# Patient Record
Sex: Male | Born: 1990 | Race: Black or African American | Hispanic: No | Marital: Single | Smoking: Current every day smoker
Health system: Southern US, Community
[De-identification: ages and names within clinical notes are randomized; demographics above are authoritative.]

## PROBLEM LIST (undated history)

## (undated) HISTORY — PX: ABDOMINAL SURGERY: SHX537

---

## 2020-01-08 ENCOUNTER — Other Ambulatory Visit: Payer: Self-pay

## 2020-01-08 ENCOUNTER — Encounter (HOSPITAL_COMMUNITY): Payer: Self-pay

## 2020-01-08 ENCOUNTER — Ambulatory Visit (HOSPITAL_COMMUNITY)
Admission: EM | Admit: 2020-01-08 | Discharge: 2020-01-08 | Disposition: A | Discharge status: Home / Self Care | Payer: HRSA Program | Attending: Family Medicine | Admitting: Family Medicine

## 2020-01-08 DIAGNOSIS — R197 Diarrhea, unspecified: Secondary | ICD-10-CM | POA: Diagnosis present

## 2020-01-08 DIAGNOSIS — R110 Nausea: Secondary | ICD-10-CM | POA: Insufficient documentation

## 2020-01-08 DIAGNOSIS — Z20822 Contact with and (suspected) exposure to covid-19: Secondary | ICD-10-CM | POA: Insufficient documentation

## 2020-01-08 MED ORDER — ONDANSETRON 4 MG PO TBDP: 4.0000 mg | ORAL_TABLET | Freq: Three times a day (TID) | ORAL | 0 refills | Status: AC | PRN

## 2020-01-08 NOTE — Discharge Instructions (Signed)
COVID test pending, monitor mychart for results Zofran for nausea Push fluids Follow up if symptoms not improving or worsening

## 2020-01-08 NOTE — ED Triage Notes (Signed)
Pt c/o nausea and vomiting on Monday and Tuesday, has improved since but is requesting COVID testing

## 2020-01-09 LAB — SARS CORONAVIRUS 2 (TAT 6-24 HRS): SARS Coronavirus 2: POSITIVE — AB

## 2020-01-09 NOTE — ED Provider Notes (Signed)
[facility]    CSN: [id number] Arrival date & time: 01/08/20  1904      History   Chief Complaint Chief Complaint  Patient presents with   Nausea    HPI Brian Glenn is a 29 y.o. male presenting today for Covid testing. Patient reports that earlier this week he was having GI symptoms of nausea vomiting and diarrhea. He reports that he is no longer vomiting, continues to feel mildly nauseous and continues to have some mild diarrhea as well. Work requesting Covid test prior to returning. Denies associated URI symptoms. Denies known exposure. Denies fevers. Denies abdominal pain. Tolerating oral intake.  HPI  History reviewed. No pertinent past medical history.  There are no problems to display for this patient.   History reviewed. No pertinent surgical history.     Home Medications    Prior to Admission medications   Medication Sig Start Date End Date Taking? Authorizing Provider  ondansetron (ZOFRAN ODT) 4 MG disintegrating tablet Take 1 tablet (4 mg total) by mouth every 8 (eight) hours as needed for nausea or vomiting. 01/08/20   Lorraine Cimmino, Junius Creamer, PA-C    Family History No family history on file.  Social History Social History   Tobacco Use   Smoking status: Not on file  Substance Use Topics   Alcohol use: Not on file   Drug use: Not on file     Allergies   Patient has no known allergies.   Review of Systems Review of Systems  Constitutional: Negative for activity change, appetite change, chills, fatigue and fever.  HENT: Negative for congestion, ear pain, rhinorrhea, sinus pressure, sore throat and trouble swallowing.   Eyes: Negative for discharge and redness.  Respiratory: Negative for cough, chest tightness and shortness of breath.   Cardiovascular: Negative for chest pain.  Gastrointestinal: Positive for diarrhea and nausea. Negative for abdominal pain and vomiting.  Musculoskeletal: Negative for myalgias.  Skin: Negative for rash.   Neurological: Negative for dizziness, light-headedness and headaches.     Physical Exam Triage Vital Signs ED Triage Vitals [01/08/20 1949]  Enc Vitals Group     BP 139/76     Pulse Rate 66     Resp 16     Temp 98 F (36.7 C)     Temp src      SpO2 100 %     Weight      Height      Head Circumference      Peak Flow      Pain Score 0     Pain Loc      Pain Edu?      Excl. in GC?    No data found.  Updated Vital Signs BP 139/76    Pulse 66    Temp 98 F (36.7 C)    Resp 16    SpO2 100%   Visual Acuity Right Eye Distance:   Left Eye Distance:   Bilateral Distance:    Right Eye Near:   Left Eye Near:    Bilateral Near:     Physical Exam Vitals and nursing note reviewed.  Constitutional:      Appearance: He is well-developed.     Comments: No acute distress  HENT:     Head: Normocephalic and atraumatic.     Ears:     Comments: Bilateral ears without tenderness to palpation of external auricle, tragus and mastoid, EAC's without erythema or swelling, TM's with good bony landmarks and cone of  light. Non erythematous.     Nose: Nose normal.     Mouth/Throat:     Comments: Oral mucosa pink and moist, no tonsillar enlargement or exudate. Posterior pharynx patent and nonerythematous, no uvula deviation or swelling. Normal phonation. Eyes:     Conjunctiva/sclera: Conjunctivae normal.  Cardiovascular:     Rate and Rhythm: Normal rate.  Pulmonary:     Effort: Pulmonary effort is normal. No respiratory distress.     Comments: Breathing comfortably at rest, CTABL, no wheezing, rales or other adventitious sounds auscultated Abdominal:     General: There is no distension.     Comments: Soft, nondistended, nontender to light and deep palpation throughout abdomen  Musculoskeletal:        General: Normal range of motion.     Cervical back: Neck supple.  Skin:    General: Skin is warm and dry.  Neurological:     Mental Status: He is alert and oriented to person, place,  and time.      UC Treatments / Results  Labs (all labs ordered are listed, but only abnormal results are displayed) Labs Reviewed  SARS CORONAVIRUS 2 (TAT 6-24 HRS) - Abnormal; Notable for the following components:      Result Value   SARS Coronavirus 2 POSITIVE (*)    All other components within normal limits    EKG   Radiology No results found.  Procedures Procedures (including critical care time)  Medications Ordered in UC Medications - No data to display  Initial Impression / Assessment and Plan / UC Course  I have reviewed the triage vital signs and the nursing notes.  Pertinent labs & imaging results that were available during my care of the patient were reviewed by me and considered in my medical decision making (see chart for details).     Covid test pending, monitor my chart for results. Continue symptomatic and supportive care, Zofran for nausea push fluids and oral rehydration.  Discussed strict return precautions. Patient verbalized understanding and is agreeable with plan.  Final Clinical Impressions(s) / UC Diagnoses   Final diagnoses:  Nausea  Diarrhea, unspecified type  Encounter for laboratory testing for COVID-19 virus     Discharge Instructions     COVID test pending, monitor mychart for results Zofran for nausea Push fluids Follow up if symptoms not improving or worsening   ED Prescriptions    Medication Sig Dispense Auth. Provider   ondansetron (ZOFRAN ODT) 4 MG disintegrating tablet Take 1 tablet (4 mg total) by mouth every 8 (eight) hours as needed for nausea or vomiting. 20 tablet Ashtan Laton, [place] C, PA-C     PDMP not reviewed this encounter.   Sharyon Cable Shiner C, [place] 01/09/20 [phone]

## 2020-02-28 ENCOUNTER — Emergency Department (HOSPITAL_COMMUNITY): Payer: Self-pay

## 2020-02-28 ENCOUNTER — Encounter (HOSPITAL_COMMUNITY): Payer: Self-pay | Admitting: Emergency Medicine

## 2020-02-28 ENCOUNTER — Other Ambulatory Visit: Payer: Self-pay

## 2020-02-28 ENCOUNTER — Emergency Department (HOSPITAL_COMMUNITY)
Admission: EM | Admit: 2020-02-28 | Discharge: 2020-02-28 | Disposition: A | Discharge status: Home / Self Care | Payer: Self-pay | Attending: Emergency Medicine | Admitting: Emergency Medicine

## 2020-02-28 DIAGNOSIS — F17200 Nicotine dependence, unspecified, uncomplicated: Secondary | ICD-10-CM | POA: Insufficient documentation

## 2020-02-28 DIAGNOSIS — K047 Periapical abscess without sinus: Secondary | ICD-10-CM | POA: Insufficient documentation

## 2020-02-28 LAB — I-STAT CHEM 8, ED
BUN: 9 mg/dL (ref 6–20)
Calcium, Ion: 1.13 mmol/L — ABNORMAL LOW (ref 1.15–1.40)
Chloride: 106 mmol/L (ref 98–111)
Creatinine, Ser: 1.2 mg/dL (ref 0.61–1.24)
Glucose, Bld: 88 mg/dL (ref 70–99)
HCT: 46 % (ref 39.0–52.0)
Hemoglobin: 15.6 g/dL (ref 13.0–17.0)
Potassium: 5.9 mmol/L — ABNORMAL HIGH (ref 3.5–5.1)
Sodium: 139 mmol/L (ref 135–145)
TCO2: 26 mmol/L (ref 22–32)

## 2020-02-28 MED ORDER — CLINDAMYCIN PHOSPHATE 900 MG/50ML IV SOLN
900.0000 mg | Freq: Once | INTRAVENOUS | Status: AC
  Administered 2020-02-28: 900 mg via INTRAVENOUS
  Filled 2020-02-28: qty 50

## 2020-02-28 MED ORDER — CLINDAMYCIN HCL 150 MG PO CAPS: 450.0000 mg | ORAL_CAPSULE | Freq: Three times a day (TID) | ORAL | 0 refills | Status: AC

## 2020-02-28 MED ORDER — LIDOCAINE-EPINEPHRINE 1 %-1:100000 IJ SOLN
10.0000 mL | Freq: Once | INTRAMUSCULAR | Status: DC
  Filled 2020-02-28: qty 1

## 2020-02-28 MED ORDER — IBUPROFEN 600 MG PO TABS
600.0000 mg | ORAL_TABLET | Freq: Four times a day (QID) | ORAL | 0 refills | Status: DC | PRN
Start: 2020-02-28 — End: 2022-05-08

## 2020-02-28 MED ORDER — HYDROCODONE-ACETAMINOPHEN 5-325 MG PO TABS
1.0000 | ORAL_TABLET | Freq: Once | ORAL | Status: AC
  Administered 2020-02-28: 1 via ORAL
  Filled 2020-02-28: qty 1

## 2020-02-28 MED ORDER — IOHEXOL 300 MG/ML  SOLN
75.0000 mL | Freq: Once | INTRAMUSCULAR | Status: AC | PRN
  Administered 2020-02-28: 75 mL via INTRAVENOUS

## 2020-02-28 NOTE — ED Triage Notes (Signed)
Pt. Stated, Brian Glenn had a toothache since Friday and when I woke up this morning the rt. Side of my face and jaw is swollen.

## 2020-02-28 NOTE — ED Provider Notes (Signed)
  Dental Block  Date/Time: 02/28/2020 5:00 PM Performed by: Anselm Pancoast, PA-C Authorized by: Anselm Pancoast, PA-C   Consent:    Consent obtained:  Verbal   Consent given by:  Patient   Risks discussed:  Infection, nerve damage, swelling, unsuccessful block and pain Indications:    Indications: dental abscess   Location:    Block type:  Inferior alveolar   Laterality:  Right Procedure details (see MAR for exact dosages):    Topical anesthetic:  Benzocaine gel   Syringe type:  Controlled syringe   Needle gauge:  27 G   Anesthetic injected:  Bupivacaine 0.5% WITH epi   Injection procedure:  Anatomic landmarks identified, anatomic landmarks palpated, introduced needle, negative aspiration for blood and incremental injection Post-procedure details:    Outcome:  Anesthesia achieved   Patient tolerance of procedure:  Tolerated well, no immediate complications  .Marland KitchenIncision and Drainage  Date/Time: 02/28/2020 5:52 PM Performed by: Anselm Pancoast, PA-C Authorized by: Anselm Pancoast, PA-C   Consent:    Consent obtained:  Verbal   Consent given by:  Patient   Risks discussed:  Bleeding, incomplete drainage and pain Location:    Type:  Abscess   Size:  1.7   Location:  Mouth   Mouth location:  Alveolar process Pre-procedure details:    Skin preparation:  Antiseptic wash Anesthesia (see MAR for exact dosages):    Anesthesia method:  Topical application and nerve block   Topical anesthetic:  Benzocaine gel   Block location:  Inferior alveolar and apical   Block needle gauge:  27 G   Block anesthetic:  Bupivacaine 0.5% WITH epi   Block injection procedure:  Anatomic landmarks identified, anatomic landmarks palpated, introduced needle, negative aspiration for blood and incremental injection   Block outcome:  Anesthesia achieved Procedure type:    Complexity:  Simple Procedure details:    Needle aspiration: yes     Needle size:  18 G   Incision depth:  Submucosal   Wound management:   Irrigated with saline   Drainage:  Bloody and purulent   Drainage amount:  Scant   Packing materials:  None Post-procedure details:    Patient tolerance of procedure:  Tolerated well, no immediate complications     Concepcion Living 02/28/20 1801    Tilden Fossa, MD 02/28/20 [phone]

## 2020-02-28 NOTE — ED Notes (Signed)
Pt has + swelling of right side of face. Airway is intact. Pt not drooling.

## 2020-02-28 NOTE — ED Provider Notes (Signed)
[facility] EMERGENCY DEPARTMENT Provider Note   CSN: [id number] Arrival date & time: 02/28/20  1026     History Chief Complaint  Patient presents with  . Dental Problem  . Oral Swelling    Brian Glenn is a 29 y.o. male.  The history is provided by the patient.   Brian Glenn is a 29 y.o. male who presents to the Emergency Department complaining of dental pain and facial swelling. Two days ago he had pain to his right lower jaw. Yesterday he woke up with swelling to his jaw and neck. He denies any fevers, difficulty breathing, difficulty swallowing. No change in voice. He does not have a dentist. Symptoms are severe and constant nature. He has no known medical problems and takes no medications. He did try ibuprofen at home for his pain.    History reviewed. No pertinent past medical history.  There are no problems to display for this patient.   History reviewed. No pertinent surgical history.     No family history on file.  Social History   Tobacco Use  . Smoking status: Current Every Day Smoker  . Smokeless tobacco: Never Used  Substance Use Topics  . Alcohol use: Not on file  . Drug use: Yes    Types: Marijuana    Home Medications Prior to Admission medications   Medication Sig Start Date End Date Taking? Authorizing Provider  clindamycin (CLEOCIN) 150 MG capsule Take 3 capsules (450 mg total) by mouth 3 (three) times daily. 02/28/20   Tilden Fossa, MD  ibuprofen (ADVIL) 600 MG tablet Take 1 tablet (600 mg total) by mouth every 6 (six) hours as needed. 02/28/20   Tilden Fossa, MD  ondansetron (ZOFRAN ODT) 4 MG disintegrating tablet Take 1 tablet (4 mg total) by mouth every 8 (eight) hours as needed for nausea or vomiting. 01/08/20   Wieters, Hallie C, PA-C    Allergies    Patient has no known allergies.  Review of Systems   Review of Systems  All other systems reviewed and are negative.   Physical Exam Updated Vital Signs BP  130/90   Pulse 68   Temp 98.3 F (36.8 C) (Oral)   Resp 18   Ht 5\' 8"  (1.727 m)   Wt 93 kg   SpO2 100%   BMI 31.17 kg/m   Physical Exam Vitals and nursing note reviewed.  Constitutional:      Appearance: He is well-developed.  HENT:     Head: Normocephalic and atraumatic.     Comments: Poor dentition.  No elevation of the floor of mouth.  There is moderate to severe edema of the right lower jaw with focal tenderness to palpation.  There is fullness to the right neck.   Cardiovascular:     Rate and Rhythm: Normal rate and regular rhythm.     Heart sounds: No murmur heard.   Pulmonary:     Effort: Pulmonary effort is normal. No respiratory distress.     Breath sounds: Normal breath sounds.  Abdominal:     Palpations: Abdomen is soft.     Tenderness: There is no abdominal tenderness. There is no guarding or rebound.  Musculoskeletal:        General: No tenderness.  Skin:    General: Skin is warm and dry.  Neurological:     Mental Status: He is alert and oriented to person, place, and time.  Psychiatric:        Behavior: Behavior normal.  ED Results / Procedures / Treatments   Labs (all labs ordered are listed, but only abnormal results are displayed) Labs Reviewed  I-STAT CHEM 8, ED - Abnormal; Notable for the following components:      Result Value   Potassium 5.9 (*)    Calcium, Ion 1.13 (*)    All other components within normal limits    EKG EKG Interpretation  Date/Time:  Sunday February 28 2020 15:39:03 EDT Ventricular Rate:  59 PR Interval:    QRS Duration: 90 QT Interval:  374 QTC Calculation: 371 R Axis:   76 Text Interpretation: Sinus rhythm Confirmed by Tilden Fossa [phone]) on 02/28/2020 5:19:11 PM   Radiology CT Soft Tissue Neck W Contrast  Result Date: 02/28/2020 CLINICAL DATA:  Neck abscess. EXAM: CT NECK WITH CONTRAST TECHNIQUE: Multidetector CT imaging of the neck was performed using the standard protocol following the bolus  administration of intravenous contrast. CONTRAST:  68mL OMNIPAQUE IOHEXOL 300 MG/ML  SOLN COMPARISON:  None. FINDINGS: 1.7 x 0.7 cm peripherally enhancing right perimandibular fluid collection communicating with a periapical lucency along the right mandibular first molar root (3:69, 8:69). Diffuse right greater than left submandibular soft tissue swelling. Bilateral submandibular adenopathy measuring up to 1.1 cm in the right 1B region (3:73), reactive. Diffuse thickening of the right platysma. Periapical lucency involving the left mandibular second molar root. Pharynx and larynx: No mass.  No significant airway narrowing. Salivary glands: Bilateral parotid and submandibular glands are unremarkable. Thyroid: Normal. Lymph nodes: Prominent subcentimeter bilateral 2A nodes, also reactive. Vascular: Normal intravascular enhancement. Limited intracranial: Grossly unremarkable. Visualized orbits: Normal orbits. Mastoids and visualized paranasal sinuses: Clear paranasal sinuses. No mastoid effusion. Skeleton: No acute osseous abnormality. Upper chest: Clear apices. Other: None. IMPRESSION: 1.7 cm right perimandibular odontogenic abscess likely arising from the right mandibular first molar root. Right greater than left perimandibular soft tissue swelling with reactive adenopathy. Periapical lucency along the left mandibular second molar root may reflect carious disease. Electronically Signed   By: Stana Bunting M.D.   On: 02/28/2020 15:13    Procedures Procedures (including critical care time)  Medications Ordered in ED Medications  clindamycin (CLEOCIN) IVPB 900 mg (0 mg Intravenous Stopped 02/28/20 1502)  HYDROcodone-acetaminophen (NORCO/VICODIN) 5-325 MG per tablet 1 tablet (1 tablet Oral Given 02/28/20 1350)  iohexol (OMNIPAQUE) 300 MG/ML solution 75 mL (75 mLs Intravenous Contrast Given 02/28/20 1435)    ED Course  I have reviewed the triage vital signs and the nursing notes.  Pertinent labs &  imaging results that were available during my care of the patient were reviewed by me and considered in my medical decision making (see chart for details).    MDM Rules/Calculators/A&P                         patient here for evaluation of facial pain and swelling. Examination is consistent with dental abscess. CTA obtained to evaluate extent of abscess. I stat chem 8 obtained for creatinine prior to CT.  Chem 8 with mild hyperkalemia - unclear if specimen hemolyzed.  Cr is wnl and EKG without acute abnormalities.  CT demonstrates periapical abscess, no evidence of extension into neck.  I and D performed per PA note.  Plan to treat with abx with close dentistry follow up and return precautions.   Final Clinical Impression(s) / ED Diagnoses Final diagnoses:  Dental abscess    Rx / DC Orders ED Discharge Orders  Ordered    clindamycin (CLEOCIN) 150 MG capsule  3 times daily        02/28/20 1644    ibuprofen (ADVIL) 600 MG tablet  Every 6 hours PRN        02/28/20 1644           Tilden Fossa, MD 02/28/20 [phone]

## 2021-12-20 IMAGING — CT CT NECK W/ CM
4 of 5 series · 15 of 33 positions shown, 17 images · IV contrast (Omni 300)
Comparison: None.

CLINICAL DATA: Neck abscess.

EXAM:
CT NECK WITH CONTRAST
TECHNIQUE: Multidetector CT imaging of the neck was performed using the
standard protocol following the bolus administration of intravenous
contrast.
CONTRAST:  75mL OMNIPAQUE IOHEXOL 300 MG/ML  SOLN

[Series 3: neck 2.0 st · axial · 0.56mm/px · z∈[+1393,+1555]mm · 4 of 136 slices shown, 5 images]
[im 28/136  soft-tissue]
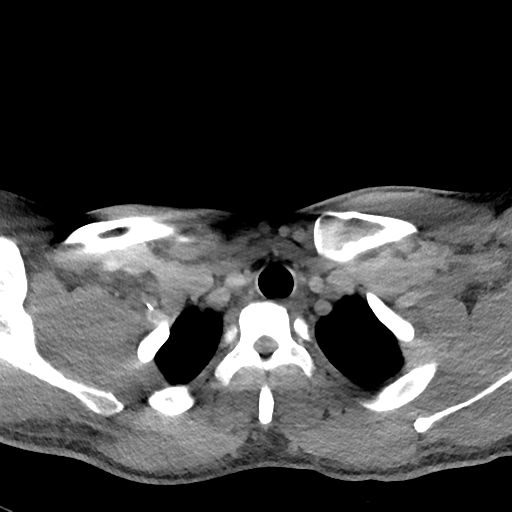
[im 28/136  bone]
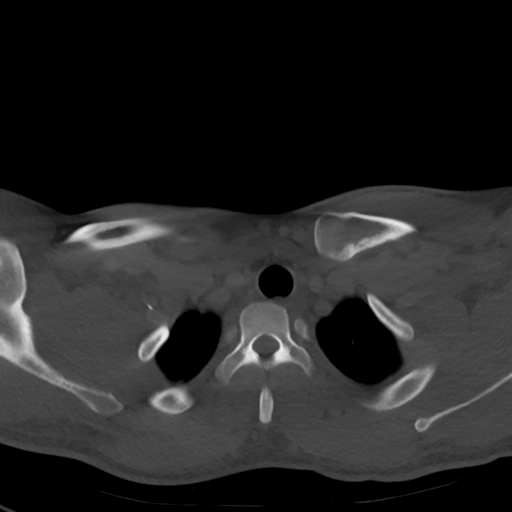
[im 55/136  bone]
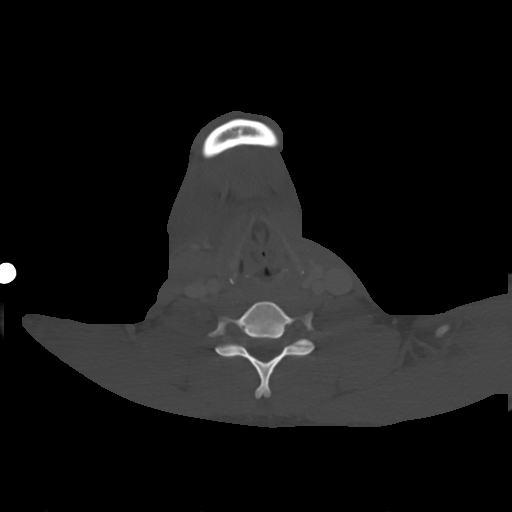
[im 82/136  bone]
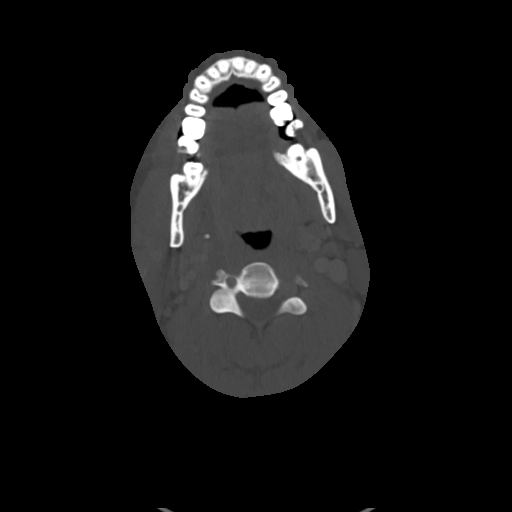
[im 109/136  bone]
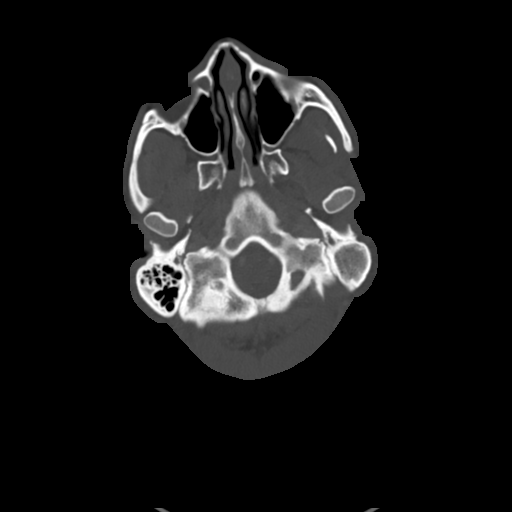

[Series 5: sagittal · sagittal · 0.53mm/px · 5 of 119 slices shown, 6 images]
[im 40/119  bone]
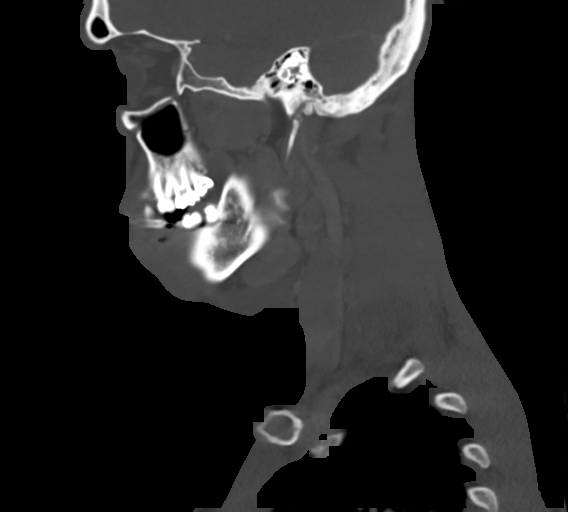
[im 50/119  bone]
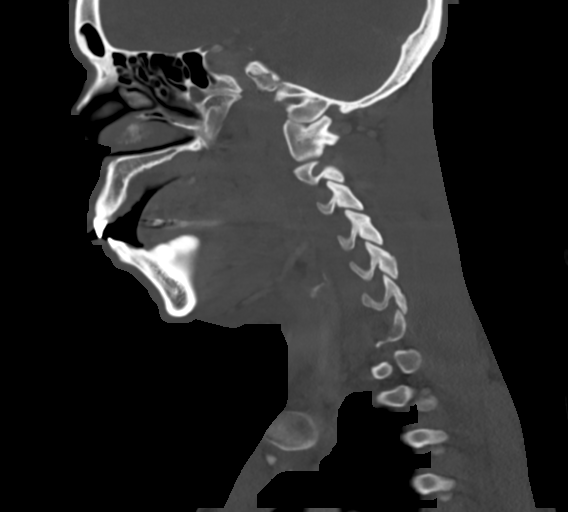
[im 60/119  soft-tissue]
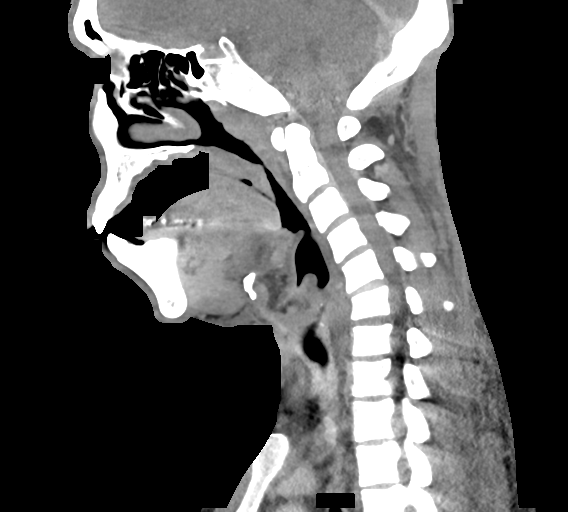
[im 60/119  bone]
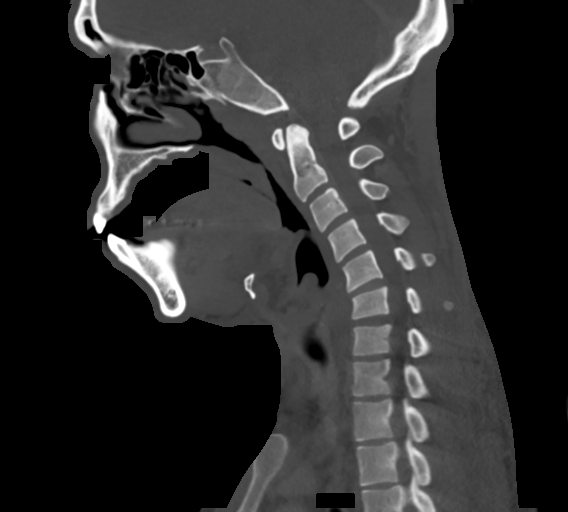
[im 69/119  bone]
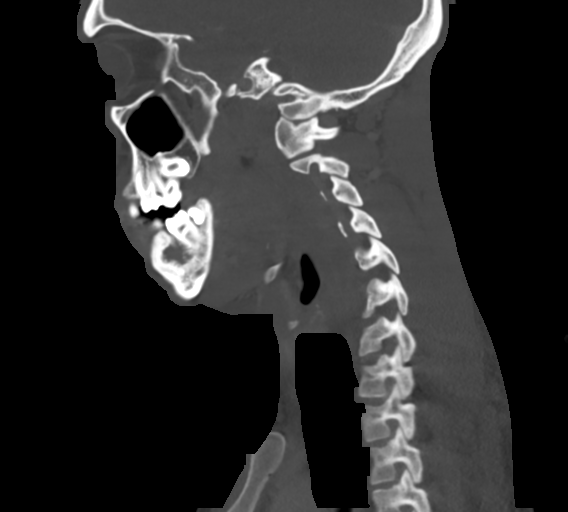
[im 79/119  bone]
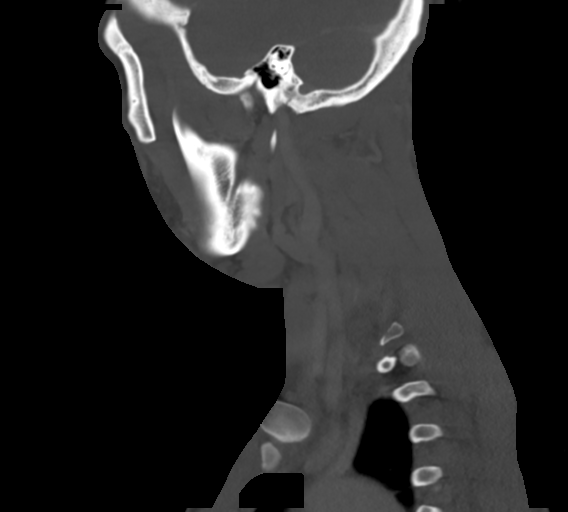

[Series 6: coronal · coronal · 0.59mm/px · 3 of 121 slices shown]
[im 25/121  bone]
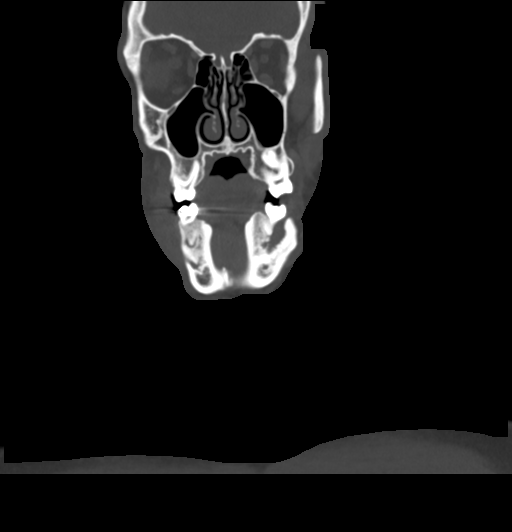
[im 49/121  bone]
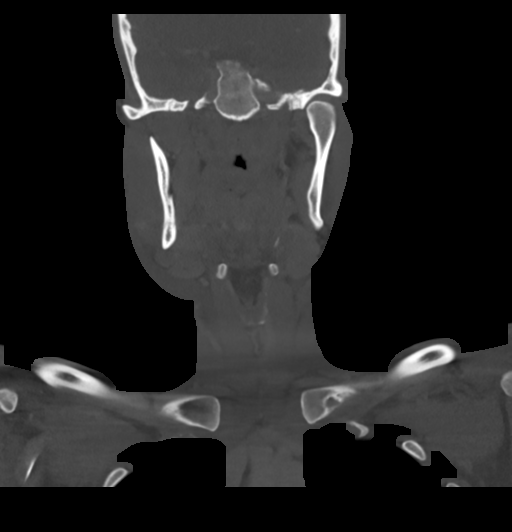
[im 73/121  bone]
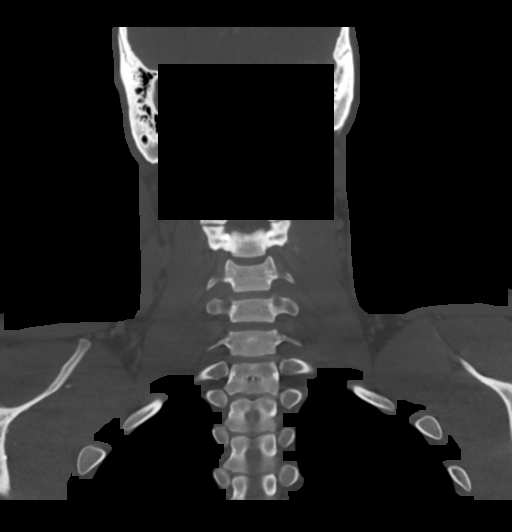

[Series 7: orthogonal · axial · 0.39mm/px · z∈[+1393,+1501]mm · 3 of 135 slices shown]
[im 27/135  bone]
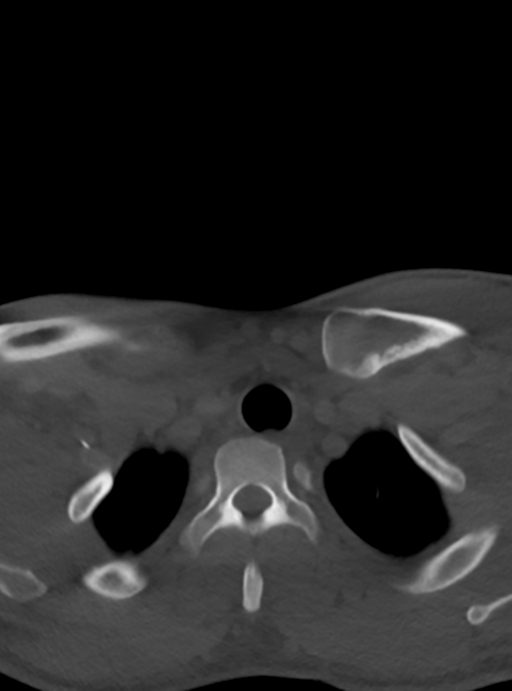
[im 54/135  bone]
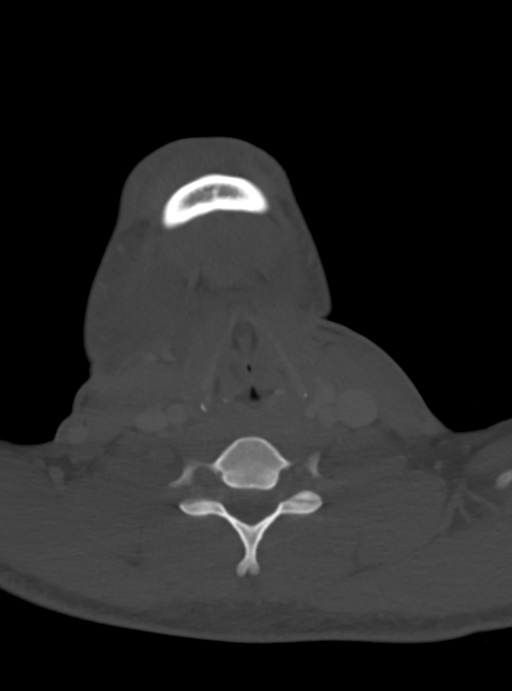
[im 81/135  bone]
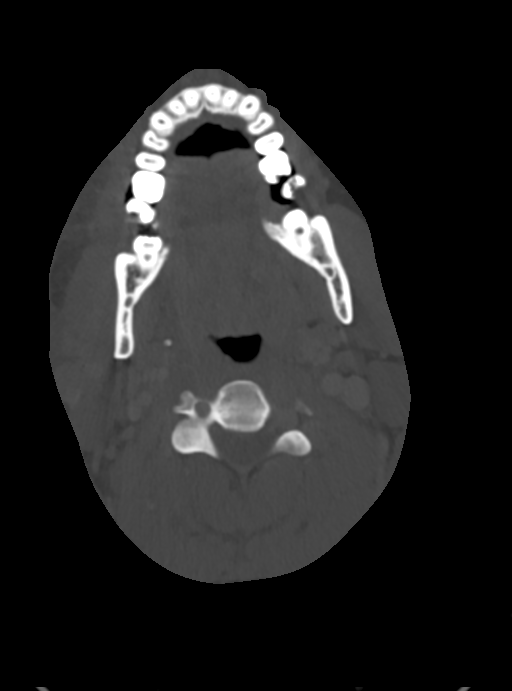

[15 of 33 positions shown; findings below may reference images not displayed]

FINDINGS: 1.7 x 0.7 cm peripherally enhancing right perimandibular fluid
collection communicating with a periapical lucency along the right
mandibular first molar root ([DATE], [DATE]).

Diffuse right greater than left submandibular soft tissue swelling.
Bilateral submandibular adenopathy measuring up to 1.1 cm in the
right 1B region ([DATE]), reactive.

Diffuse thickening of the right platysma. Periapical lucency
involving the left mandibular second molar root.

Pharynx and larynx: No mass.  No significant airway narrowing.

Salivary glands: Bilateral parotid and submandibular glands are
unremarkable.

Thyroid: Normal.

Lymph nodes: Prominent subcentimeter bilateral 2A nodes, also
reactive.

Vascular: Normal intravascular enhancement.

Limited intracranial: Grossly unremarkable.

Visualized orbits: Normal orbits.

Mastoids and visualized paranasal sinuses: Clear paranasal sinuses.
No mastoid effusion.

Skeleton: No acute osseous abnormality.

Upper chest: Clear apices.

Other: None.
IMPRESSION: 1.7 cm right perimandibular odontogenic abscess likely arising from
the right mandibular first molar root.

Right greater than left perimandibular soft tissue swelling with
reactive adenopathy.

Periapical lucency along the left mandibular second molar root may
reflect carious disease.

## 2022-05-08 ENCOUNTER — Encounter (HOSPITAL_COMMUNITY): Payer: Self-pay | Admitting: Emergency Medicine

## 2022-05-08 ENCOUNTER — Other Ambulatory Visit: Payer: Self-pay

## 2022-05-08 ENCOUNTER — Emergency Department (HOSPITAL_COMMUNITY)
Admission: EM | Admit: 2022-05-08 | Discharge: 2022-05-08 | Disposition: A | Discharge status: Home / Self Care | Payer: Self-pay | Attending: Emergency Medicine | Admitting: Emergency Medicine

## 2022-05-08 DIAGNOSIS — R051 Acute cough: Secondary | ICD-10-CM | POA: Insufficient documentation

## 2022-05-08 DIAGNOSIS — J101 Influenza due to other identified influenza virus with other respiratory manifestations: Secondary | ICD-10-CM | POA: Insufficient documentation

## 2022-05-08 DIAGNOSIS — Z1152 Encounter for screening for COVID-19: Secondary | ICD-10-CM | POA: Insufficient documentation

## 2022-05-08 DIAGNOSIS — F17200 Nicotine dependence, unspecified, uncomplicated: Secondary | ICD-10-CM | POA: Insufficient documentation

## 2022-05-08 LAB — RESP PANEL BY RT-PCR (FLU A&B, COVID) ARPGX2
Influenza A by PCR: POSITIVE — AB
Influenza B by PCR: NEGATIVE
SARS Coronavirus 2 by RT PCR: NEGATIVE

## 2022-05-08 MED ORDER — IBUPROFEN 600 MG PO TABS: 600.0000 mg | ORAL_TABLET | Freq: Four times a day (QID) | ORAL | 0 refills | Status: AC | PRN

## 2022-05-08 MED ORDER — ACETAMINOPHEN 325 MG PO TABS: 650.0000 mg | ORAL_TABLET | Freq: Four times a day (QID) | ORAL | 0 refills | Status: AC | PRN

## 2022-05-08 MED ORDER — GUAIFENESIN 100 MG/5ML PO LIQD: 5.0000 mL | ORAL | 0 refills | Status: AC | PRN

## 2022-05-08 MED ORDER — FLUTICASONE PROPIONATE 50 MCG/ACT NA SUSP: 1.0000 | Freq: Every day | NASAL | 0 refills | Status: AC

## 2022-05-08 NOTE — ED Triage Notes (Signed)
Pt arrived POV from home stating this weekend he had a fever, last week he had flu like symptoms. Pt states he feels a lot better today but still needed to be checked out.

## 2022-05-08 NOTE — ED Provider Triage Note (Signed)
Emergency Medicine Provider Triage Evaluation Note  Brian Glenn , a 31 y.o. male  was evaluated in triage.  Pt complains of fever, body aches, and fatigue over the weekend. He says his fever has broken now and he is feeling better, but wanted to get checked out.   Review of Systems  Positive:  Negative:   Physical Exam  BP 115/78 (BP Location: Right Arm)   Pulse 69   Temp 98.3 F (36.8 C)   Resp 16   Ht 5\' 9"  (1.753 m)   Wt 84.4 kg   SpO2 100%   BMI 27.47 kg/m  Gen:   Awake, no distress   Resp:  Normal effort  MSK:   Moves extremities without difficulty  Other:    Medical Decision Making  Medically screening exam initiated at 10:50 AM.  Appropriate orders placed.  Beauregard Jarrells was informed that the remainder of the evaluation will be completed by another provider, this initial triage assessment does not replace that evaluation, and the importance of remaining in the ED until their evaluation is complete.     Donnamarie Poag, PA-C 05/08/22 1051

## 2022-05-08 NOTE — ED Provider Notes (Signed)
[facility] EMERGENCY DEPARTMENT Provider Note   CSN: [id number] Arrival date & time: 05/08/22  9470     History  Chief Complaint  Patient presents with   Fever    Brian Glenn is a 31 y.o. male.  Patient as above with significant medical history as below, including no sig med hx who presents to the ED with complaint of uri s/s Onset early Saturday morning Low grade fever Coughing, rhinorrhea clear, non prod cough No n/v but poor appetite yesterday, has improved Had diarrhea yestd but resolved No brbpr or melena No urine changes No travel or sick contacts      History reviewed. No pertinent past medical history.  History reviewed. No pertinent surgical history.   The history is provided by the patient. No language interpreter was used.  Fever Associated symptoms: congestion, cough, diarrhea and rhinorrhea   Associated symptoms: no chest pain, no chills, no confusion, no headaches, no nausea, no rash and no vomiting        Home Medications Prior to Admission medications   Medication Sig Start Date End Date Taking? Authorizing Provider  acetaminophen (TYLENOL) 325 MG tablet Take 2 tablets (650 mg total) by mouth every 6 (six) hours as needed. 05/08/22  Yes Tanda Rockers A, DO  fluticasone (FLONASE) 50 MCG/ACT nasal spray Place 1 spray into both nostrils daily for 7 days. 05/08/22 05/15/22 Yes Sloan Leiter, DO  guaiFENesin (ROBITUSSIN) 100 MG/5ML liquid Take 5 mLs by mouth every 4 (four) hours as needed for cough or to loosen phlegm. 05/08/22  Yes Tanda Rockers A, DO  ibuprofen (ADVIL) 600 MG tablet Take 1 tablet (600 mg total) by mouth every 6 (six) hours as needed. 05/08/22  Yes Tanda Rockers A, DO  clindamycin (CLEOCIN) 150 MG capsule Take 3 capsules (450 mg total) by mouth 3 (three) times daily. 02/28/20   Tilden Fossa, MD  ondansetron (ZOFRAN ODT) 4 MG disintegrating tablet Take 1 tablet (4 mg total) by mouth every 8 (eight) hours as needed for  nausea or vomiting. 01/08/20   Wieters, Hallie C, PA-C      Allergies    Patient has no known allergies.    Review of Systems   Review of Systems  Constitutional:  Positive for fever. Negative for chills.  HENT:  Positive for congestion and rhinorrhea. Negative for facial swelling and trouble swallowing.   Eyes:  Negative for photophobia and visual disturbance.  Respiratory:  Positive for cough. Negative for shortness of breath.   Cardiovascular:  Negative for chest pain and palpitations.  Gastrointestinal:  Positive for diarrhea. Negative for abdominal pain, nausea and vomiting.  Endocrine: Negative for polydipsia and polyuria.  Genitourinary:  Negative for difficulty urinating and hematuria.  Musculoskeletal:  Negative for gait problem and joint swelling.  Skin:  Negative for pallor and rash.  Neurological:  Negative for syncope and headaches.  Psychiatric/Behavioral:  Negative for agitation and confusion.     Physical Exam Updated Vital Signs BP 122/82 (BP Location: Right Arm)   Pulse 66   Temp 98.9 F (37.2 C) (Oral)   Resp 16   Ht 5\' 9"  (1.753 m)   Wt 84.4 kg   SpO2 100%   BMI 27.47 kg/m  Physical Exam Vitals and nursing note reviewed.  Constitutional:      General: He is not in acute distress.    Appearance: Normal appearance. He is well-developed. He is not ill-appearing, toxic-appearing or diaphoretic.  HENT:     Head: Normocephalic  and atraumatic.     Right Ear: External ear normal.     Left Ear: External ear normal.     Mouth/Throat:     Mouth: Mucous membranes are moist.     Pharynx: No oropharyngeal exudate or posterior oropharyngeal erythema.  Eyes:     General: No scleral icterus. Cardiovascular:     Rate and Rhythm: Normal rate and regular rhythm.     Pulses: Normal pulses.     Heart sounds: Normal heart sounds.  Pulmonary:     Effort: Pulmonary effort is normal. No respiratory distress.     Breath sounds: Normal breath sounds.  Abdominal:      General: Abdomen is flat.     Palpations: Abdomen is soft.     Tenderness: There is no abdominal tenderness. There is no guarding or rebound.  Musculoskeletal:        General: Normal range of motion.     Cervical back: Normal range of motion. No rigidity.     Right lower leg: No edema.     Left lower leg: No edema.  Skin:    General: Skin is warm and dry.     Capillary Refill: Capillary refill takes less than 2 seconds.  Neurological:     Mental Status: He is alert and oriented to person, place, and time.  Psychiatric:        Mood and Affect: Mood normal.        Behavior: Behavior normal.     ED Results / Procedures / Treatments   Labs (all labs ordered are listed, but only abnormal results are displayed) Labs Reviewed  RESP PANEL BY RT-PCR (FLU A&B, COVID) ARPGX2 - Abnormal; Notable for the following components:      Result Value   Influenza A by PCR POSITIVE (*)    All other components within normal limits    EKG None  Radiology No results found.  Procedures Procedures    Medications Ordered in ED Medications - No data to display  ED Course/ Medical Decision Making/ A&P                           Medical Decision Making Risk OTC drugs. Prescription drug management.   This patient presents to the ED with chief complaint(s) of uri s/s with pertinent past medical history of as above which further complicates the presenting complaint. The complaint involves an extensive differential diagnosis and also carries with it a high risk of complications and morbidity.    The differential diagnosis includes but not limited to viral bacterial pna, strep, covid, flu, sinusitis, etc. Serious etiologies were considered.   The initial plan is to rvp   Additional history obtained: Additional history obtained from  na Records reviewed  prior ed visit, prior uc visit,  prior meds/imaging  Independent labs interpretation:  The following labs were independently interpreted:  FLU +'tive  Independent visualization of imaging: Imaging not necessary at this time  Cardiac monitoring was reviewed and interpreted by myself which shows na  Treatment and Reassessment: Symptoms stayed the same while in the ED< reports he is feeling better since yesterday  Consultation: - Consulted or discussed management/test interpretation w/ external professional: na   Consideration for admission or further workup: Admission was considered   Pt here with uri s/s, found to have flu, outside of tamiflu treatment window. Not hypoxic, he is HDS on ambient air. Well appearing overall. D/w pt in regards to supportive  care and o/p f/u  The patient improved significantly and was discharged in stable condition. Detailed discussions were had with the patient regarding current findings, and need for close f/u with PCP or on call doctor. The patient has been instructed to return immediately if the symptoms worsen in any way for re-evaluation. Patient verbalized understanding and is in agreement with current care plan. All questions answered prior to discharge.    Social Determinants of health: No pcp Thc use  Social History   Tobacco Use   Smoking status: Every Day   Smokeless tobacco: Never  Substance Use Topics   Drug use: Yes    Types: Marijuana            Final Clinical Impression(s) / ED Diagnoses Final diagnoses:  Influenza A  Acute cough    Rx / DC Orders ED Discharge Orders          Ordered    guaiFENesin (ROBITUSSIN) 100 MG/5ML liquid  Every 4 hours PRN        05/08/22 1436    acetaminophen (TYLENOL) 325 MG tablet  Every 6 hours PRN        05/08/22 1436    ibuprofen (ADVIL) 600 MG tablet  Every 6 hours PRN        05/08/22 1436    fluticasone (FLONASE) 50 MCG/ACT nasal spray  Daily        05/08/22 1436              Sloan Leiter, DO 05/08/22 1444

## 2022-05-08 NOTE — Discharge Instructions (Addendum)
It was a pleasure caring for you today in the emergency department. ° °Please return to the emergency department for any worsening or worrisome symptoms. ° ° °

## 2022-05-09 ENCOUNTER — Emergency Department (HOSPITAL_COMMUNITY)
Admission: EM | Admit: 2022-05-09 | Discharge: 2022-05-09 | Disposition: A | Discharge status: Home / Self Care | Payer: Self-pay | Attending: Emergency Medicine | Admitting: Emergency Medicine

## 2022-05-09 ENCOUNTER — Other Ambulatory Visit: Payer: Self-pay

## 2022-05-09 ENCOUNTER — Encounter (HOSPITAL_COMMUNITY): Payer: Self-pay

## 2022-05-09 DIAGNOSIS — J111 Influenza due to unidentified influenza virus with other respiratory manifestations: Secondary | ICD-10-CM | POA: Insufficient documentation

## 2022-05-09 LAB — CBG MONITORING, ED: Glucose-Capillary: 79 mg/dL (ref 70–99)

## 2022-05-09 MED ORDER — IBUPROFEN 800 MG PO TABS
800.0000 mg | ORAL_TABLET | Freq: Once | ORAL | Status: AC
  Administered 2022-05-09: 800 mg via ORAL
  Filled 2022-05-09: qty 1

## 2022-05-09 NOTE — ED Notes (Signed)
Pt called X2 for triage. Pt could not be found.  

## 2022-05-09 NOTE — Discharge Instructions (Signed)
Drink plenty of fluids.  Take Tylenol or Motrin for fever and aches.  And just rest

## 2022-05-09 NOTE — ED Triage Notes (Signed)
Pt arrived complaining of his flu symptoms returning states he was not able to pick up medications. Now having headache, body aches and dizziness   Pt states he has not had any new fevers

## 2022-05-09 NOTE — ED Provider Triage Note (Signed)
Emergency Medicine Provider Triage Evaluation Note  Brian Glenn , a 31 y.o. male  was evaluated in triage.  Pt complains of worsening body aches, sore throat, chills. Onset of symptoms on Saturday. He was diagnosed with the flu this morning. States that he was unable to take any medications for his symptoms because he "does not have access to medicines". He is presently homeless.  Review of Systems  Positive: As above Negative: As above  Physical Exam  BP 122/86 (BP Location: Right Arm)   Pulse (!) 57   Temp 97.8 F (36.6 C) (Oral)   Resp 18   SpO2 99%  Gen:   Awake, no distress   Resp:  Normal effort  MSK:   Moves extremities without difficulty  Other:  Lungs CTAB.   Medical Decision Making  Medically screening exam initiated at 2:32 AM.  Appropriate orders placed.  Brian Glenn was informed that the remainder of the evaluation will be completed by another provider, this initial triage assessment does not replace that evaluation, and the importance of remaining in the ED until their evaluation is complete.  Influenza, sequelae. Homeless without access to medications for symptom relief. Ibuprofen given in triage. May benefit from SW involvement in AM.   Antony Madura, [place] 05/09/22 0235

## 2022-05-09 NOTE — ED Provider Notes (Signed)
[facility] EMERGENCY DEPARTMENT Provider Note   CSN: [id number] Arrival date & time: 05/09/22  [id number]     History  Chief Complaint  Patient presents with   Influenza    Brian Glenn is a 31 y.o. male.  Patient was diagnosed with influenza yesterday.  He has been ill for over 3 days he was unable to get his medicines.  His symptoms have been going on since Friday.  He complains of myalgias.  No nausea no vomiting  The history is provided by the patient and medical records. No language interpreter was used.  Influenza Presenting symptoms: cough   Presenting symptoms: no diarrhea, no fatigue and no headaches   Severity:  Moderate Onset quality:  Sudden Progression:  Unchanged Chronicity:  New Relieved by:  Nothing Worsened by:  Nothing Associated symptoms: no chills and no congestion        Home Medications Prior to Admission medications   Medication Sig Start Date End Date Taking? Authorizing Provider  acetaminophen (TYLENOL) 325 MG tablet Take 2 tablets (650 mg total) by mouth every 6 (six) hours as needed. 05/08/22   Jeanell Sparrow, DO  clindamycin (CLEOCIN) 150 MG capsule Take 3 capsules (450 mg total) by mouth 3 (three) times daily. 02/28/20   Quintella Reichert, MD  fluticasone [facility]) 50 MCG/ACT nasal spray Place 1 spray into both nostrils daily for 7 days. 05/08/22 05/15/22  Jeanell Sparrow, DO  guaiFENesin (ROBITUSSIN) 100 MG/5ML liquid Take 5 mLs by mouth every 4 (four) hours as needed for cough or to loosen phlegm. 05/08/22   Jeanell Sparrow, DO  ibuprofen (ADVIL) 600 MG tablet Take 1 tablet (600 mg total) by mouth every 6 (six) hours as needed. 05/08/22   Jeanell Sparrow, DO  ondansetron (ZOFRAN ODT) 4 MG disintegrating tablet Take 1 tablet (4 mg total) by mouth every 8 (eight) hours as needed for nausea or vomiting. 01/08/20   Wieters, Hallie C, PA-C      Allergies    Patient has no known allergies.    Review of Systems   Review of Systems   Constitutional:  Negative for appetite change, chills and fatigue.  HENT:  Negative for congestion, ear discharge and sinus pressure.   Eyes:  Negative for discharge.  Respiratory:  Positive for cough.   Cardiovascular:  Negative for chest pain.  Gastrointestinal:  Negative for abdominal pain and diarrhea.  Genitourinary:  Negative for frequency and hematuria.  Musculoskeletal:  Negative for back pain.  Skin:  Negative for rash.  Neurological:  Negative for seizures and headaches.  Psychiatric/Behavioral:  Negative for hallucinations.     Physical Exam Updated Vital Signs BP 105/79 (BP Location: Left Arm)   Pulse (!) 57   Temp 97.8 F (36.6 C) (Oral)   Resp 16   Ht 5\' 9"  (1.753 m)   Wt 84.4 kg   SpO2 97%   BMI 27.47 kg/m  Physical Exam Vitals and nursing note reviewed.  Constitutional:      Appearance: He is well-developed.  HENT:     Head: Normocephalic.     Nose: Nose normal.  Eyes:     General: No scleral icterus.    Conjunctiva/sclera: Conjunctivae normal.  Neck:     Thyroid: No thyromegaly.  Cardiovascular:     Rate and Rhythm: Normal rate and regular rhythm.     Heart sounds: No murmur heard.    No friction rub. No gallop.  Pulmonary:     Breath sounds:  No stridor. No wheezing or rales.  Chest:     Chest wall: No tenderness.  Abdominal:     General: There is no distension.     Tenderness: There is no abdominal tenderness. There is no rebound.  Musculoskeletal:        General: Normal range of motion.     Cervical back: Neck supple.  Lymphadenopathy:     Cervical: No cervical adenopathy.  Skin:    Findings: No erythema or rash.  Neurological:     Mental Status: He is alert and oriented to person, place, and time.     Motor: No abnormal muscle tone.     Coordination: Coordination normal.  Psychiatric:        Behavior: Behavior normal.     ED Results / Procedures / Treatments   Labs (all labs ordered are listed, but only abnormal results are  displayed) Labs Reviewed  CBG MONITORING, ED    EKG None  Radiology No results found.  Procedures Procedures    Medications Ordered in ED Medications  ibuprofen (ADVIL) tablet 800 mg (has no administration in time range)  ibuprofen (ADVIL) tablet 800 mg (800 mg Oral Given 05/09/22 0253)    ED Course/ Medical Decision Making/ A&P                           Medical Decision Making Risk Prescription drug management.    This patient presents to the ED for concern of fevers aches, this involves an extensive number of treatment options, and is a complaint that carries with it a high risk of complications and morbidity.  The differential diagnosis includes influenza, pneumonia   Co morbidities that complicate the patient evaluation  None   Additional history obtained:  Additional history obtained from patient External records from outside source obtained and reviewed including hospital records   Lab Tests:  No labs Imaging Studies ordered: No imaging  Cardiac Monitoring: / EKG:  The patient was maintained on a cardiac monitor.  I personally viewed and interpreted the cardiac monitored which showed an underlying rhythm of: Normal sinus rhythm   Consultations Obtained:  No consultant  Problem List / ED Course / Critical interventions / Medication management  Influenza No medicines ordered Reevaluation of the patient after these medicines showed that the patient stayed the same I have reviewed the patients home medicines and have made adjustments as needed   Social Determinants of Health:  None   Test / Admission - Considered:  None    Patient with influenza.  He will be treated with Tylenol Motrin and follow-up as needed        Final Clinical Impression(s) / ED Diagnoses Final diagnoses:  Influenza    Rx / DC Orders ED Discharge Orders     None         Bethann Berkshire, MD 05/13/22 [phone]

## 2022-11-30 ENCOUNTER — Encounter (HOSPITAL_COMMUNITY): Payer: Self-pay | Admitting: *Deleted

## 2022-11-30 ENCOUNTER — Emergency Department (HOSPITAL_COMMUNITY): Payer: Self-pay

## 2022-11-30 ENCOUNTER — Emergency Department (HOSPITAL_COMMUNITY)
Admission: EM | Admit: 2022-11-30 | Discharge: 2022-11-30 | Disposition: A | Discharge status: Home / Self Care | Payer: Self-pay | Attending: Emergency Medicine | Admitting: Emergency Medicine

## 2022-11-30 ENCOUNTER — Other Ambulatory Visit: Payer: Self-pay

## 2022-11-30 DIAGNOSIS — T148XXA Other injury of unspecified body region, initial encounter: Secondary | ICD-10-CM

## 2022-11-30 DIAGNOSIS — Y92410 Unspecified street and highway as the place of occurrence of the external cause: Secondary | ICD-10-CM | POA: Insufficient documentation

## 2022-11-30 DIAGNOSIS — Z2914 Encounter for prophylactic rabies immune globin: Secondary | ICD-10-CM | POA: Insufficient documentation

## 2022-11-30 DIAGNOSIS — Z23 Encounter for immunization: Secondary | ICD-10-CM | POA: Insufficient documentation

## 2022-11-30 DIAGNOSIS — S40811A Abrasion of right upper arm, initial encounter: Secondary | ICD-10-CM | POA: Insufficient documentation

## 2022-11-30 DIAGNOSIS — M542 Cervicalgia: Secondary | ICD-10-CM | POA: Insufficient documentation

## 2022-11-30 DIAGNOSIS — R079 Chest pain, unspecified: Secondary | ICD-10-CM | POA: Insufficient documentation

## 2022-11-30 DIAGNOSIS — W5311XA Bitten by rat, initial encounter: Secondary | ICD-10-CM | POA: Insufficient documentation

## 2022-11-30 DIAGNOSIS — Y9384 Activity, sleeping: Secondary | ICD-10-CM | POA: Insufficient documentation

## 2022-11-30 DIAGNOSIS — Z203 Contact with and (suspected) exposure to rabies: Secondary | ICD-10-CM | POA: Insufficient documentation

## 2022-11-30 LAB — CBC WITH DIFFERENTIAL/PLATELET
Abs Immature Granulocytes: 0.01 10*3/uL (ref 0.00–0.07)
Basophils Absolute: 0 10*3/uL (ref 0.0–0.1)
Basophils Relative: 1 %
Eosinophils Absolute: 0.2 10*3/uL (ref 0.0–0.5)
Eosinophils Relative: 3 %
HCT: 42.6 % (ref 39.0–52.0)
Hemoglobin: 13.7 g/dL (ref 13.0–17.0)
Immature Granulocytes: 0 %
Lymphocytes Relative: 34 %
Lymphs Abs: 2.4 10*3/uL (ref 0.7–4.0)
MCH: 27.2 pg (ref 26.0–34.0)
MCHC: 32.2 g/dL (ref 30.0–36.0)
MCV: 84.5 fL (ref 80.0–100.0)
Monocytes Absolute: 0.8 10*3/uL (ref 0.1–1.0)
Monocytes Relative: 11 %
Neutro Abs: 3.6 10*3/uL (ref 1.7–7.7)
Neutrophils Relative %: 51 %
Platelets: 180 10*3/uL (ref 150–400)
RBC: 5.04 MIL/uL (ref 4.22–5.81)
RDW: 14.1 % (ref 11.5–15.5)
WBC: 7 10*3/uL (ref 4.0–10.5)
nRBC: 0 % (ref 0.0–0.2)

## 2022-11-30 LAB — BASIC METABOLIC PANEL
Anion gap: 4 — ABNORMAL LOW (ref 5–15)
BUN: 13 mg/dL (ref 6–20)
CO2: 24 mmol/L (ref 22–32)
Calcium: 8.2 mg/dL — ABNORMAL LOW (ref 8.9–10.3)
Chloride: 109 mmol/L (ref 98–111)
Creatinine, Ser: 1.11 mg/dL (ref 0.61–1.24)
GFR, Estimated: >60 mL/min (ref >60)
Glucose, Bld: 89 mg/dL (ref 70–99)
Potassium: 4 mmol/L (ref 3.5–5.1)
Sodium: 137 mmol/L (ref 135–145)

## 2022-11-30 LAB — CK: Total CK: 583 U/L — ABNORMAL HIGH (ref 49–397)

## 2022-11-30 LAB — TROPONIN I (HIGH SENSITIVITY)
Troponin I (High Sensitivity): 3 ng/L (ref <18)
Troponin I (High Sensitivity): 3 ng/L (ref <18)

## 2022-11-30 MED ORDER — RABIES IMMUNE GLOBULIN 150 UNIT/ML IM INJ
20.0000 [IU]/kg | INJECTION | Freq: Once | INTRAMUSCULAR | Status: AC
  Administered 2022-11-30: 1650 [IU] via INTRAMUSCULAR
  Filled 2022-11-30: qty 12

## 2022-11-30 MED ORDER — LIDOCAINE 5 % EX PTCH
1.0000 | MEDICATED_PATCH | CUTANEOUS | Status: DC
  Administered 2022-11-30: 1 via TRANSDERMAL
  Filled 2022-11-30: qty 1

## 2022-11-30 MED ORDER — AMOXICILLIN-POT CLAVULANATE 875-125 MG PO TABS: 1.0000 | ORAL_TABLET | Freq: Two times a day (BID) | ORAL | 0 refills | Status: AC

## 2022-11-30 MED ORDER — SODIUM CHLORIDE 0.9 % IV BOLUS
1000.0000 mL | Freq: Once | INTRAVENOUS | Status: AC
  Administered 2022-11-30: 1000 mL via INTRAVENOUS

## 2022-11-30 MED ORDER — AMOXICILLIN-POT CLAVULANATE 875-125 MG PO TABS
1.0000 | ORAL_TABLET | Freq: Once | ORAL | Status: AC
  Administered 2022-11-30: 1 via ORAL
  Filled 2022-11-30: qty 1

## 2022-11-30 MED ORDER — IBUPROFEN 400 MG PO TABS
600.0000 mg | ORAL_TABLET | Freq: Once | ORAL | Status: AC
  Administered 2022-11-30: 600 mg via ORAL
  Filled 2022-11-30: qty 1

## 2022-11-30 MED ORDER — RABIES VACCINE, PCEC IM SUSR
1.0000 mL | Freq: Once | INTRAMUSCULAR | Status: AC
  Administered 2022-11-30: 1 mL via INTRAMUSCULAR
  Filled 2022-11-30: qty 1

## 2022-11-30 MED ORDER — TETANUS-DIPHTH-ACELL PERTUSSIS 5-2.5-18.5 LF-MCG/0.5 IM SUSY
0.5000 mL | PREFILLED_SYRINGE | Freq: Once | INTRAMUSCULAR | Status: AC
  Administered 2022-11-30: 0.5 mL via INTRAMUSCULAR
  Filled 2022-11-30: qty 0.5

## 2022-11-30 NOTE — ED Notes (Signed)
Pt stated that he began to have chest pain a few hours post-rat bite. Pt stated that the pain radiated towards his left shoulder and his neck.

## 2022-11-30 NOTE — ED Provider Notes (Signed)
[place] EMERGENCY DEPARTMENT AT [facility] Provider Note   CSN: [id number] Arrival date & time: 11/30/22  1528     History  Chief Complaint  Patient presents with   Animal Bite    Brian Glenn is a 32 y.o. male with no significant past medical history who presents to the ED after a rat bite.  Patient notes he was bit by what he suspects was a rat on his right upper arm while sleeping on the street last night.  Patient did not see rat.  Has a small bite wound to right upper arm.  Unsure when his last tetanus shot was.  No fever or chills.  Patient also admits to left-sided, sharp chest pain that started earlier this morning.  Also endorses left shoulder and neck pain.  No cardiac history.  No history of blood clots, recent surgeries, recent long immobilizations, or hormonal treatments.  Denies lower extremity edema.  Denies associated shortness of breath, nausea, or diaphoresis.  No injury to chest wall or left shoulder.  Patient also endorses sporadic muscle spasms.  He notes he has been riding his bike frequently.  Notes he has been drinking fluids frequently.  Patient is currently homeless.  History obtained from patient and past medical records. No interpreter used during encounter.       Home Medications Prior to Admission medications   Medication Sig Start Date End Date Taking? Authorizing Provider  amoxicillin-clavulanate (AUGMENTIN) 875-125 MG tablet Take 1 tablet by mouth every 12 (twelve) hours. 11/30/22  Yes Marcos Ruelas, Merla Riches, PA-C  acetaminophen (TYLENOL) 325 MG tablet Take 2 tablets (650 mg total) by mouth every 6 (six) hours as needed. 05/08/22   Sloan Leiter, DO  clindamycin (CLEOCIN) 150 MG capsule Take 3 capsules (450 mg total) by mouth 3 (three) times daily. 02/28/20   Tilden Fossa, MD  fluticasone [facility]) 50 MCG/ACT nasal spray Place 1 spray into both nostrils daily for 7 days. 05/08/22 05/15/22  Sloan Leiter, DO  guaiFENesin (ROBITUSSIN) 100  MG/5ML liquid Take 5 mLs by mouth every 4 (four) hours as needed for cough or to loosen phlegm. 05/08/22   Sloan Leiter, DO  ibuprofen (ADVIL) 600 MG tablet Take 1 tablet (600 mg total) by mouth every 6 (six) hours as needed. 05/08/22   Sloan Leiter, DO  ondansetron (ZOFRAN ODT) 4 MG disintegrating tablet Take 1 tablet (4 mg total) by mouth every 8 (eight) hours as needed for nausea or vomiting. 01/08/20   Wieters, Hallie C, PA-C      Allergies    Patient has no known allergies.    Review of Systems   Review of Systems  Constitutional:  Negative for chills and fever.  Respiratory:  Negative for shortness of breath.   Cardiovascular:  Positive for chest pain.  Gastrointestinal:  Negative for abdominal pain.  Musculoskeletal:  Positive for myalgias.  Skin:  Positive for color change and wound.    Physical Exam Updated Vital Signs BP 122/80   Pulse 61   Temp 97.7 F (36.5 C) (Oral)   Resp 16   Ht 5\' 8"  (1.727 m)   Wt 83.6 kg   SpO2 99%   BMI 28.02 kg/m  Physical Exam Vitals and nursing note reviewed.  Constitutional:      General: He is not in acute distress.    Appearance: He is not ill-appearing.  HENT:     Head: Normocephalic.  Eyes:     Pupils: Pupils are equal,  round, and reactive to light.  Cardiovascular:     Rate and Rhythm: Normal rate and regular rhythm.     Pulses: Normal pulses.     Heart sounds: Normal heart sounds. No murmur heard.    No friction rub. No gallop.  Pulmonary:     Effort: Pulmonary effort is normal.     Breath sounds: Normal breath sounds.  Abdominal:     General: Abdomen is flat. There is no distension.     Palpations: Abdomen is soft.     Tenderness: There is no abdominal tenderness. There is no guarding or rebound.  Musculoskeletal:        General: Normal range of motion.     Cervical back: Neck supple.  Skin:    General: Skin is warm and dry.     Comments: Small superficial abrasion to right upper arm.  No surrounding erythema.  No  fluctuance or induration.  Neurological:     General: No focal deficit present.     Mental Status: He is alert.  Psychiatric:        Mood and Affect: Mood normal.        Behavior: Behavior normal.     ED Results / Procedures / Treatments   Labs (all labs ordered are listed, but only abnormal results are displayed) Labs Reviewed  BASIC METABOLIC PANEL - Abnormal; Notable for the following components:      Result Value   Calcium 8.2 (*)    Anion gap 4 (*)    All other components within normal limits  CK - Abnormal; Notable for the following components:   Total CK 583 (*)    All other components within normal limits  CBC WITH DIFFERENTIAL/PLATELET  TROPONIN I (HIGH SENSITIVITY)  TROPONIN I (HIGH SENSITIVITY)    EKG EKG Interpretation Date/Time:  Friday November 30 2022 15:29:26 EDT Ventricular Rate:  74 PR Interval:  128 QRS Duration:  92 QT Interval:  364 QTC Calculation: 404 R Axis:   79  Text Interpretation: Normal sinus rhythm ST elevation, consider early repolarization Nonspecific ST abnormality Abnormal ECG When compared with ECG of 28-Feb-2020 15:39, PREVIOUS ECG IS PRESENT New since previous tracing Confirmed by Vanetta Mulders [phone]) on 11/30/2022 5:18:08 PM  Radiology DG Humerus Right  Result Date: 11/30/2022 CLINICAL DATA:  Animal bite mark EXAM: RIGHT HUMERUS - 2 VIEW COMPARISON:  None Available. FINDINGS: There is no evidence of fracture or other focal bone lesions. Soft tissues are unremarkable. IMPRESSION: No acute osseous abnormality Electronically Signed   By: Karen Kays M.D.   On: 11/30/2022 17:30   DG Chest Portable 1 View  Result Date: 11/30/2022 CLINICAL DATA:  Chest pain EXAM: PORTABLE CHEST 1 VIEW COMPARISON:  None Available. FINDINGS: The heart size and mediastinal contours are within normal limits. Both lungs are clear. No consolidation, pneumothorax or effusion. No edema. The visualized skeletal structures are unremarkable. IMPRESSION: No acute  cardiopulmonary disease. Electronically Signed   By: Karen Kays M.D.   On: 11/30/2022 17:29    Procedures Procedures    Medications Ordered in ED Medications  lidocaine (LIDODERM) 5 % 1 patch (1 patch Transdermal Patch Applied 11/30/22 1727)  lidocaine (LIDODERM) 5 % 1 patch (1 patch Transdermal Patch Applied 11/30/22 2014)  Tdap (BOOSTRIX) injection 0.5 mL (0.5 mLs Intramuscular Given 11/30/22 1734)  ibuprofen (ADVIL) tablet 600 mg (600 mg Oral Given 11/30/22 1730)  rabies immune globulin (HYPERRAB/KEDRAB) injection 1,650 Units (1,650 Units Intramuscular Given 11/30/22 1818)  rabies vaccine (RABAVERT)  injection 1 mL (1 mL Intramuscular Given 11/30/22 1817)  amoxicillin-clavulanate (AUGMENTIN) 875-125 MG per tablet 1 tablet (1 tablet Oral Given 11/30/22 1816)  sodium chloride 0.9 % bolus 1,000 mL (0 mLs Intravenous Stopped 11/30/22 2004)    ED Course/ Medical Decision Making/ A&P                             Medical Decision Making Amount and/or Complexity of Data Reviewed Labs: ordered. Decision-making details documented in ED Course. Radiology: ordered and independent interpretation performed. Decision-making details documented in ED Course. ECG/medicine tests: ordered and independent interpretation performed. Decision-making details documented in ED Course.  Risk Prescription drug management.   This patient presents to the ED for concern of CP, this involves an extensive number of treatment options, and is a complaint that carries with it a high risk of complications and morbidity.  The differential diagnosis includes ACS, PE, dissection, PNA, MSK, etc  32 year old male presents to the ED after a suspected rat bite to right upper arm last night while sleeping outside.  Patient is currently homeless.  Also endorses left-sided sharp chest pain, shoulder pain, neck pain.  Also admits to muscle spasms.  Unsure when his last tetanus shot was.  Upon arrival patient afebrile, not tachycardic  or hypoxic.  Patient in no acute distress.  Small superficial abrasion to right upper arm without surrounding erythema, fluctuance, or induration.  No lower extremity edema.  Routine labs ordered.  Troponin and EKG to rule out ACS.  CK.  Tetanus and rabies shots given.  CBC unremarkable.  No leukocytosis.  Normal hemoglobin.  BMP reassuring.  Normal renal function.  No major electrolyte derangements.  CK mildly elevated.  IV fluids given.  Troponin x 2 normal.  EKG demonstrates normal sinus rhythm.  Nonspecific ST wave abnormalities.  Given normal troponin, low suspicion for ACS.  Chest x-ray personally reviewed and interpreted which is negative for any acute abnormalities.  Humerus x-ray negative for any acute abnormalities.  Patient given rabies shots and tetanus updated.  Upon reassessment patient chest pain-free.  Presentation nonconcerning for PE or aortic dissection.  Atypical chest pain.  Rabies vaccine schedule given to patient at discharge.  Advised patient to follow-up with PCP within 1 week for further evaluation. Patient stable for discharge. Strict ED precautions discussed with patient. Patient states understanding and agrees to plan. Patient discharged home in no acute distress and stable vitals.  No PCP homeless       Final Clinical Impression(s) / ED Diagnoses Final diagnoses:  Animal bite  Nonspecific chest pain    Rx / DC Orders ED Discharge Orders          Ordered    amoxicillin-clavulanate (AUGMENTIN) 875-125 MG tablet  Every 12 hours        11/30/22 2008              Jesusita Oka 11/30/22 2015    Vanetta Mulders, MD 12/01/22 1408

## 2022-11-30 NOTE — Discharge Instructions (Addendum)
                                  RABIES VACCINE FOLLOW UP  Patient's Name: Brian Glenn                     Original Order Date:11/30/2022  Medical Record Number: [id number]  ED Physician: Vanetta Mulders, MD Primary Diagnosis: Rabies Exposure       PCP: Patient, No Pcp Per  Patient Phone Number: (home) [phone] (home)    (cell)  Telephone Information:  Mobile [phone]    (work) [phone] (work) Species of Animal:     You have been seen in the Emergency Department for a possible rabies exposure. It's very important you return for the additional vaccine doses.  Please call the clinic listed below for hours of operation.   Clinic that will administer your rabies vaccines:    DAY 0:  11/30/2022      DAY 3:  12/03/2022       DAY 7:  12/07/2022     DAY 14:  12/14/2022         The 5th vaccine injection is considered for immune compromised patients only.  DAY 28:  12/28/2022       It was a pleasure taking care of you today.  As discussed, your labs are reassuring.  Above are the dates you need to return to the ER or urgent care for further rabies vaccines.  Follow-up with PCP within the next few days.  Return to the ER for new or worsening symptoms.  I am sending you home with antibiotics.  Take as prescribed and finish all antibiotics.

## 2022-11-30 NOTE — ED Triage Notes (Signed)
Here by bike from [facility] for animal bite. Bitten by rat last night at [facility]. Bite to R posterior upper arm, above elbow. C/o R posterior upper arm bite, bite pain, and other various unrelated pain, CP, L shoulder pain with movement. No meds PTA. Sleepy in triage. Denies ever having rabies vaccine. Unsure of Td status.

## 2022-12-26 ENCOUNTER — Other Ambulatory Visit (HOSPITAL_COMMUNITY)
Admission: RE | Admit: 2022-12-26 | Discharge: 2022-12-26 | Disposition: A | Discharge status: Home / Self Care | Payer: Self-pay | Source: Ambulatory Visit | Attending: Physician Assistant | Admitting: Physician Assistant

## 2022-12-26 ENCOUNTER — Encounter: Payer: Self-pay | Admitting: Physician Assistant

## 2022-12-26 ENCOUNTER — Ambulatory Visit: Payer: Self-pay | Admitting: Physician Assistant

## 2022-12-26 VITALS — BP 113/78 | HR 60 | Ht 68.0 in | Wt 179.0 lb

## 2022-12-26 DIAGNOSIS — Z113 Encounter for screening for infections with a predominantly sexual mode of transmission: Secondary | ICD-10-CM | POA: Insufficient documentation

## 2022-12-26 NOTE — Progress Notes (Unsigned)
New Patient Office Visit  Subjective    Patient ID: Brian Glenn, male    DOB: 03/14/1991  Age: 32 y.o. MRN: [id number]  CC:  Chief Complaint  Patient presents with   STD  Screening     HPI Shantel Wesely request screening for STDs.  Denies any current symptoms of STDs, lesions or genital warts.  Denies any known exposure.  No other concerns at this time      Outpatient Encounter Medications as of 12/26/2022  Medication Sig   acetaminophen (TYLENOL) 325 MG tablet Take 2 tablets (650 mg total) by mouth every 6 (six) hours as needed. (Patient not taking: Reported on 12/26/2022)   amoxicillin-clavulanate (AUGMENTIN) 875-125 MG tablet Take 1 tablet by mouth every 12 (twelve) hours. (Patient not taking: Reported on 12/26/2022)   clindamycin (CLEOCIN) 150 MG capsule Take 3 capsules (450 mg total) by mouth 3 (three) times daily. (Patient not taking: Reported on 12/26/2022)   fluticasone (FLONASE) 50 MCG/ACT nasal spray Place 1 spray into both nostrils daily for 7 days.   guaiFENesin (ROBITUSSIN) 100 MG/5ML liquid Take 5 mLs by mouth every 4 (four) hours as needed for cough or to loosen phlegm. (Patient not taking: Reported on 12/26/2022)   ibuprofen (ADVIL) 600 MG tablet Take 1 tablet (600 mg total) by mouth every 6 (six) hours as needed. (Patient not taking: Reported on 12/26/2022)   ondansetron (ZOFRAN ODT) 4 MG disintegrating tablet Take 1 tablet (4 mg total) by mouth every 8 (eight) hours as needed for nausea or vomiting. (Patient not taking: Reported on 12/26/2022)   No facility-administered encounter medications on file as of 12/26/2022.    History reviewed. No pertinent past medical history.  Past Surgical History:  Procedure Laterality Date   ABDOMINAL SURGERY      History reviewed. No pertinent family history.  Social History   Socioeconomic History   Marital status: Single    Spouse name: Not on file   Number of children: Not on file   Years of education: Not on file    Highest education level: Not on file  Occupational History   Not on file  Tobacco Use   Smoking status: Every Day    Types: Cigarettes   Smokeless tobacco: Never  Substance and Sexual Activity   Alcohol use: Not Currently   Drug use: Not Currently    Types: Marijuana   Sexual activity: Not on file  Other Topics Concern   Not on file  Social History Narrative   Not on file   Social Determinants of Health   Financial Resource Strain: Not on file  Food Insecurity: Not on file  Transportation Needs: Not on file  Physical Activity: Not on file  Stress: Not on file  Social Connections: Unknown (07/29/2022)   [facility]   Social Network    Social Network: Not on file  Intimate Partner Violence: Unknown (07/29/2022)   Received from [facility]   HITS    Physically Hurt: Not on file    Insult or Talk Down To: Not on file    Threaten Physical Harm: Not on file    Scream or Curse: Not on file    Review of Systems  Constitutional:  Negative for chills and fever.  HENT: Negative.    Eyes: Negative.   Respiratory:  Negative for shortness of breath.   Cardiovascular:  Negative for chest pain.  Gastrointestinal:  Negative for abdominal pain.  Genitourinary:  Negative for dysuria, frequency, hematuria and  urgency.  Musculoskeletal:  Negative for back pain.  Skin: Negative.   Neurological: Negative.   Endo/Heme/Allergies: Negative.   Psychiatric/Behavioral: Negative.          Objective    BP 113/78 (BP Location: Left Arm, Patient Position: Sitting, Cuff Size: Large)   Pulse 60   Ht 5\' 8"  (1.727 m)   Wt 179 lb (81.2 kg)   SpO2 98%   BMI 27.22 kg/m   Physical Exam Vitals and nursing note reviewed.  Constitutional:      Appearance: Normal appearance.  HENT:     Head: Normocephalic and atraumatic.     Right Ear: External ear normal.     Left Ear: External ear normal.     Nose: Nose normal.     Mouth/Throat:     Mouth: Mucous membranes are moist.   Eyes:     Extraocular Movements: Extraocular movements intact.     Conjunctiva/sclera: Conjunctivae normal.     Pupils: Pupils are equal, round, and reactive to light.  Cardiovascular:     Rate and Rhythm: Normal rate and regular rhythm.     Pulses: Normal pulses.     Heart sounds: Normal heart sounds.  Pulmonary:     Effort: Pulmonary effort is normal.     Breath sounds: Normal breath sounds.  Musculoskeletal:        General: Normal range of motion.     Cervical back: Normal range of motion and neck supple.  Skin:    General: Skin is warm and dry.  Neurological:     General: No focal deficit present.     Mental Status: He is alert and oriented to person, place, and time.  Psychiatric:        Mood and Affect: Mood normal.        Behavior: Behavior normal.        Thought Content: Thought content normal.        Judgment: Judgment normal.         Assessment & Plan:   Problem List Items Addressed This Visit   None Visit Diagnoses     Screen for STD (sexually transmitted disease)    -  Primary   Relevant Orders   Urine cytology ancillary only (Completed)   HIV antibody (with reflex) (Completed)   RPR (Completed)      1. Screen for STD (sexually transmitted disease) Patient education given on safe sex practices.  Patient encouraged to return to mobile unit as needed.  Patient education given on applying for expanding Medicaid and [place] financial assistance. - Urine cytology ancillary only - HIV antibody (with reflex) - RPR   I have reviewed the patient's medical history (PMH, PSH, Social History, Family History, Medications, and allergies) , and have been updated if relevant. I spent 20 minutes reviewing chart and  face to face time with patient.    Return if symptoms worsen or fail to improve.   Kasandra Knudsen Mayers, PA-C

## 2022-12-26 NOTE — Patient Instructions (Signed)
We will call you with today's lab results.  I encourage you to return to the mobile unit as needed.  Roney Jaffe, PA-C Physician Assistant  Mobile Medicine [email or web address]   Safe Sex Practicing safe sex means taking steps before and during sex to reduce your risk of: Getting an STI (sexually transmitted infection). Giving your partner an STI. Unwanted or unplanned pregnancy. How to practice safe sex Ways you can practice safe sex  Limit your sexual partners to only one partner who is having sex with only you. Avoid using alcohol and drugs before having sex. Alcohol and drugs can affect your judgment. Before having sex with a new partner: Talk to your partner about past partners, past STIs, and drug use. Get screened for STIs and discuss the results with your partner. Ask your partner to get screened too. Check your body regularly for sores, blisters, rashes, or unusual discharge. If you notice any of these problems, visit your health care provider. Avoid sexual contact if you have symptoms of an infection or you are being treated for an STI. While having sex, use a condom. Make sure to: Use a condom every time you have vaginal, oral, or anal sex. Both females and males should wear condoms during oral sex. Keep condoms in place from the beginning to the end of sexual activity. Use a latex condom, if possible. Latex condoms offer the best protection. Use only water-based lubricants with a condom. Using petroleum-based lubricants or oils will weaken the condom and increase the chance that it will break. Ways your health care provider can help you practice safe sex  See your health care provider for regular screenings, exams, and tests for STIs. Talk with your health care provider about what kind of birth control (contraception) is best for you. Get vaccinated against hepatitis B and human papillomavirus (HPV). If you are at risk  of being infected with HIV (human immunodeficiency virus), talk with your health care provider about taking a prescription medicine to prevent HIV infection. You are at risk for HIV if you: Are a man who has sex with other men. Are sexually active with more than one partner. Take drugs by injection. Have a sex partner who has HIV. Have unprotected sex. Have sex with someone who has sex with both men and women. Have had an STI. Follow these instructions at home: Take over-the-counter and prescription medicines only as told by your health care provider. Keep all follow-up visits. This is important. Where to find more information Centers for Disease Control and Prevention: FootballExhibition.com.br Planned Parenthood: www.plannedparenthood.org Office on [employer] Health: http://hoffman.com/ Summary Practicing safe sex means taking steps before and during sex to reduce your risk getting an STI, giving your partner an STI, and having an unwanted or unplanned pregnancy. Before having sex with a new partner, talk to your partner about past partners, past STIs, and drug use. Use a condom every time you have vaginal, oral, or anal sex. Both females and males should wear condoms during oral sex. Check your body regularly for sores, blisters, rashes, or unusual discharge. If you notice any of these problems, visit your health care provider. See your health care provider for regular screenings, exams, and tests for STIs. This information is not intended to replace advice given to you by your health care provider. Make sure you discuss any questions you have with your health care provider. Document Revised: 10/26/2019 Document Reviewed: 10/26/2019 Elsevier Patient Education  2024 [employer].

## 2022-12-27 ENCOUNTER — Encounter: Payer: Self-pay | Admitting: Physician Assistant

## 2022-12-27 LAB — URINE CYTOLOGY ANCILLARY ONLY
Chlamydia: NEGATIVE
Comment: NEGATIVE
Comment: NEGATIVE
Neisseria Gonorrhea: NEGATIVE
Trichomonas: NEGATIVE

## 2022-12-27 LAB — RPR: RPR Ser Ql: NONREACTIVE

## 2022-12-27 LAB — HIV ANTIBODY (ROUTINE TESTING W REFLEX): HIV Screen 4th Generation wRfx: NONREACTIVE

## 2023-02-27 ENCOUNTER — Ambulatory Visit: Payer: Self-pay | Admitting: Physician Assistant

## 2023-02-27 ENCOUNTER — Encounter: Payer: Self-pay | Admitting: Physician Assistant

## 2023-02-27 ENCOUNTER — Other Ambulatory Visit (HOSPITAL_COMMUNITY)
Admission: RE | Admit: 2023-02-27 | Discharge: 2023-02-27 | Disposition: A | Discharge status: Home / Self Care | Payer: Self-pay | Source: Ambulatory Visit | Attending: Physician Assistant | Admitting: Physician Assistant

## 2023-02-27 VITALS — BP 119/77 | HR 63 | Ht 69.0 in | Wt 177.0 lb

## 2023-02-27 DIAGNOSIS — Z113 Encounter for screening for infections with a predominantly sexual mode of transmission: Secondary | ICD-10-CM | POA: Insufficient documentation

## 2023-02-27 NOTE — Patient Instructions (Signed)
Safe Sex  Practicing safe sex means taking steps before and during sex to reduce your risk of:  Getting an STI (sexually transmitted infection).  Giving your partner an STI.  Unwanted or unplanned pregnancy.  How to practice safe sex  Ways you can practice safe sex    Limit your sexual partners to only one partner who is having sex with only you.  Avoid using alcohol and drugs before having sex. Alcohol and drugs can affect your judgment.  Before having sex with a new partner:  Talk to your partner about past partners, past STIs, and drug use.  Get screened for STIs and discuss the results with your partner. Ask your partner to get screened too.  Check your body regularly for sores, blisters, rashes, or unusual discharge. If you notice any of these problems, visit your health care provider.  Avoid sexual contact if you have symptoms of an infection or you are being treated for an STI.  While having sex, use a condom. Make sure to:  Use a condom every time you have vaginal, oral, or anal sex. Both females and males should wear condoms during oral sex.  Keep condoms in place from the beginning to the end of sexual activity.  Use a latex condom, if possible. Latex condoms offer the best protection.  Use only water-based lubricants with a condom. Using petroleum-based lubricants or oils will weaken the condom and increase the chance that it will break.  Ways your health care provider can help you practice safe sex    See your health care provider for regular screenings, exams, and tests for STIs.  Talk with your health care provider about what kind of birth control (contraception) is best for you.  Get vaccinated against hepatitis B and human papillomavirus (HPV).  If you are at risk of being infected with HIV (human immunodeficiency virus), talk with your health care provider about taking a prescription medicine to prevent HIV infection. You are at risk for HIV if you:  Are a man who has sex with other men.  Are  sexually active with more than one partner.  Take drugs by injection.  Have a sex partner who has HIV.  Have unprotected sex.  Have sex with someone who has sex with both men and women.  Have had an STI.  Follow these instructions at home:  Take over-the-counter and prescription medicines only as told by your health care provider.  Keep all follow-up visits. This is important.  Where to find more information  Centers for Disease Control and Prevention: FootballExhibition.com.br  Planned Parenthood: www.plannedparenthood.org  Office on [employer] Health: http://hoffman.com/  Summary  Practicing safe sex means taking steps before and during sex to reduce your risk getting an STI, giving your partner an STI, and having an unwanted or unplanned pregnancy.  Before having sex with a new partner, talk to your partner about past partners, past STIs, and drug use.  Use a condom every time you have vaginal, oral, or anal sex. Both females and males should wear condoms during oral sex.  Check your body regularly for sores, blisters, rashes, or unusual discharge. If you notice any of these problems, visit your health care provider.  See your health care provider for regular screenings, exams, and tests for STIs.  This information is not intended to replace advice given to you by your health care provider. Make sure you discuss any questions you have with your health care provider.  Document Revised: 10/26/2019 Document Reviewed:  10/26/2019  Elsevier Patient Education  2024 [employer].

## 2023-02-27 NOTE — Progress Notes (Signed)
Patient ID: Brian Glenn, male   DOB: September 14, 1990, 32 y.o.   MRN: [id number]   Brian Glenn, is a 32 y.o. male  [id number]  [id number]  DOB - 28-Jul-1990  Chief Complaint  Patient presents with   std  Screening        Subjective:   Brian Glenn is a 32 y.o. male here today for STD testing.  No s/sx.  No penile discharge.  He does have high risk sexual encounters and has had since testing July 2024. No problems updated.  ALLERGIES: Allergies  Allergen Reactions   Penicillins Anaphylaxis    PAST MEDICAL HISTORY: No past medical history on file.  MEDICATIONS AT HOME: Prior to Admission medications   Medication Sig Start Date End Date Taking? Authorizing Provider  acetaminophen (TYLENOL) 325 MG tablet Take 2 tablets (650 mg total) by mouth every 6 (six) hours as needed. Patient not taking: Reported on 12/26/2022 05/08/22   Tanda Rockers A, DO  amoxicillin-clavulanate (AUGMENTIN) 875-125 MG tablet Take 1 tablet by mouth every 12 (twelve) hours. Patient not taking: Reported on 12/26/2022 11/30/22   Mannie Stabile, PA-C  clindamycin (CLEOCIN) 150 MG capsule Take 3 capsules (450 mg total) by mouth 3 (three) times daily. Patient not taking: Reported on 12/26/2022 02/28/20   Tilden Fossa, MD  fluticasone [facility]) 50 MCG/ACT nasal spray Place 1 spray into both nostrils daily for 7 days. 05/08/22 05/15/22  Sloan Leiter, DO  guaiFENesin (ROBITUSSIN) 100 MG/5ML liquid Take 5 mLs by mouth every 4 (four) hours as needed for cough or to loosen phlegm. Patient not taking: Reported on 12/26/2022 05/08/22   Tanda Rockers A, DO  ibuprofen (ADVIL) 600 MG tablet Take 1 tablet (600 mg total) by mouth every 6 (six) hours as needed. Patient not taking: Reported on 12/26/2022 05/08/22   Tanda Rockers A, DO  ondansetron (ZOFRAN ODT) 4 MG disintegrating tablet Take 1 tablet (4 mg total) by mouth every 8 (eight) hours as needed for nausea or vomiting. Patient not taking: Reported on 12/26/2022 01/08/20    Wieters, Hallie C, PA-C    ROS: Neg HEENT Neg resp Neg cardiac Neg GI Neg GU Neg MS Neg psych Neg neuro  Objective:   Vitals:   02/27/23 1406  BP: 119/77  Pulse: 63  Weight: 177 lb (80.3 kg)  Height: 5\' 9"  (1.753 m)   Exam General appearance : Awake, alert, not in any distress. Speech Clear. Not toxic looking HEENT: Atraumatic and Normocephalic Chest: Good air entry bilaterally, CTAB.  No rales/rhonchi/wheezing CVS: S1 S2 regular, no murmurs.  Neurology: Awake alert, and oriented X 3, CN II-XII intact, Non focal Skin: No Rash  Data Review No results found for: "HGBA1C"  Assessment & Plan   1. Screen for STD (sexually transmitted disease) Safe sex practices given - HIV antibody (with reflex) - RPR  - Urine cytology ancillary only    Return if symptoms worsen or fail to improve.  The patient was given clear instructions to go to ER or return to medical center if symptoms don't improve, worsen or new problems develop. The patient verbalized understanding. The patient was told to call to get lab results if they haven't heard anything in the next week.      Georgian Co, PA-C [facility] [place], [place] [phone]   02/27/2023, 2:22 PM

## 2023-02-28 LAB — URINE CYTOLOGY ANCILLARY ONLY
Chlamydia: NEGATIVE
Comment: NEGATIVE
Comment: NEGATIVE
Comment: NORMAL
Neisseria Gonorrhea: NEGATIVE
Trichomonas: NEGATIVE

## 2023-02-28 LAB — RPR: RPR Ser Ql: NONREACTIVE

## 2023-02-28 LAB — HIV ANTIBODY (ROUTINE TESTING W REFLEX): HIV Screen 4th Generation wRfx: NONREACTIVE

## 2023-07-10 ENCOUNTER — Other Ambulatory Visit (HOSPITAL_COMMUNITY)
Admission: RE | Admit: 2023-07-10 | Discharge: 2023-07-10 | Disposition: A | Discharge status: Home / Self Care | Payer: Medicaid Other | Source: Ambulatory Visit | Attending: Physician Assistant | Admitting: Physician Assistant

## 2023-07-10 ENCOUNTER — Encounter: Payer: Self-pay | Admitting: Physician Assistant

## 2023-07-10 ENCOUNTER — Ambulatory Visit: Payer: Medicaid Other | Admitting: Physician Assistant

## 2023-07-10 VITALS — BP 117/71 | HR 66 | Ht 68.0 in | Wt 183.0 lb

## 2023-07-10 DIAGNOSIS — Z113 Encounter for screening for infections with a predominantly sexual mode of transmission: Secondary | ICD-10-CM | POA: Diagnosis not present
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
## 2023-07-11 ENCOUNTER — Encounter: Payer: Self-pay | Admitting: Physician Assistant

## 2023-07-11 LAB — URINE CYTOLOGY ANCILLARY ONLY
Chlamydia: NEGATIVE
Comment: NEGATIVE
Comment: NEGATIVE
Comment: NORMAL
Neisseria Gonorrhea: NEGATIVE
Trichomonas: NEGATIVE

## 2023-07-11 LAB — HIV ANTIBODY (ROUTINE TESTING W REFLEX): HIV Screen 4th Generation wRfx: NONREACTIVE

## 2023-07-11 LAB — RPR: RPR Ser Ql: NONREACTIVE

## 2023-07-11 NOTE — Patient Instructions (Signed)
 VISIT SUMMARY:  You came in today for a sexually transmitted disease (STD) screening. You mentioned that you have no symptoms or known exposures but wanted to be safe. You also shared that you do not currently have a primary care provider and are waiting for your Medicaid card.   YOUR PLAN:  -STD SCREENING: You requested an STD screening for personal safety even though you have no symptoms or known exposures. We have ordered a comprehensive STD panel for you.  -PRIMARY CARE: You do not currently have a primary care provider. We discussed the importance of having a regular provider for annual check-ups and continuity of care. Once your Medicaid card arrives, it will help determine your assigned primary care provider. If you need assistance with establishing care, please return to the clinic.  INSTRUCTIONS:  Please wait for your Medicaid card to arrive to determine your assigned primary care provider. If you need help establishing care, return to the clinic for assistance.  Kirk CANDIE Sage, PA-C Physician Assistant [facility] [email or web address]

## 2023-09-19 ENCOUNTER — Other Ambulatory Visit: Payer: Self-pay

## 2023-09-19 ENCOUNTER — Emergency Department (HOSPITAL_COMMUNITY): Admission: EM | Admit: 2023-09-19 | Discharge: 2023-09-19 | Disposition: A | Discharge status: Home / Self Care

## 2023-09-19 ENCOUNTER — Encounter: Payer: Self-pay | Admitting: Physician Assistant

## 2023-09-19 ENCOUNTER — Encounter (HOSPITAL_COMMUNITY): Payer: Self-pay

## 2023-09-19 ENCOUNTER — Emergency Department (HOSPITAL_COMMUNITY)

## 2023-09-19 DIAGNOSIS — S21211A Laceration without foreign body of right back wall of thorax without penetration into thoracic cavity, initial encounter: Secondary | ICD-10-CM

## 2023-09-19 DIAGNOSIS — S29022A Laceration of muscle and tendon of back wall of thorax, initial encounter: Secondary | ICD-10-CM | POA: Diagnosis not present

## 2023-09-19 DIAGNOSIS — S41012A Laceration without foreign body of left shoulder, initial encounter: Secondary | ICD-10-CM | POA: Diagnosis present

## 2023-09-19 MED ORDER — LIDOCAINE-EPINEPHRINE (PF) 2 %-1:200000 IJ SOLN
10.0000 mL | Freq: Once | INTRAMUSCULAR | Status: DC
  Filled 2023-09-19: qty 20
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
